# Patient Record
Sex: Male | Born: 2000 | Race: Black or African American | Hispanic: No | Marital: Single | Smoking: Current every day smoker
Health system: Southern US, Community
[De-identification: ages and names within clinical notes are randomized; demographics above are authoritative.]

## PROBLEM LIST (undated history)

## (undated) DIAGNOSIS — F909 Attention-deficit hyperactivity disorder, unspecified type: Secondary | ICD-10-CM

---
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
## 2018-12-09 ENCOUNTER — Emergency Department
Admission: EM | Admit: 2018-12-09 | Discharge: 2018-12-09 | Disposition: A | Discharge status: Home / Self Care | Payer: Medicaid Other | Attending: Emergency Medicine | Admitting: Emergency Medicine

## 2018-12-09 ENCOUNTER — Other Ambulatory Visit: Payer: Self-pay

## 2018-12-09 DIAGNOSIS — J101 Influenza due to other identified influenza virus with other respiratory manifestations: Secondary | ICD-10-CM | POA: Diagnosis not present

## 2018-12-09 DIAGNOSIS — R05 Cough: Secondary | ICD-10-CM | POA: Diagnosis present

## 2018-12-09 DIAGNOSIS — F909 Attention-deficit hyperactivity disorder, unspecified type: Secondary | ICD-10-CM | POA: Diagnosis not present

## 2018-12-09 DIAGNOSIS — J111 Influenza due to unidentified influenza virus with other respiratory manifestations: Secondary | ICD-10-CM

## 2018-12-09 LAB — INFLUENZA PANEL BY PCR (TYPE A & B)
INFLBPCR: NEGATIVE
Influenza A By PCR: POSITIVE — AB

## 2018-12-09 MED ORDER — BENZONATATE 100 MG PO CAPS
100.0000 mg | ORAL_CAPSULE | Freq: Once | ORAL | Status: AC
  Administered 2018-12-09: 100 mg via ORAL
  Filled 2018-12-09: qty 1

## 2018-12-09 NOTE — ED Triage Notes (Addendum)
Pt arrives to ED via POV from home with c/o flu-like s/x's x3-4 days. Pt reports congestion, non-productive cough, generalized body aches, and diarrhea. Pt denies vomiting. Pt took OTC medications without relief. Pt did not get this years annual flu shot.

## 2018-12-09 NOTE — ED Provider Notes (Signed)
[facility] Emergency Department Provider Note __________________________________   First MD Initiated Contact with Patient 12/09/18 0116     (approximate)  I have reviewed the triage vital signs and the nursing notes.   HISTORY  Chief Complaint Influenza   HPI Stuart Beasley is a 18 y.o. male presents to the emergency department with 4-day history of cough congestion generalized body aches and objective fever.  Patient denies any known sick contact.  She states that symptoms not relieved with over-the-counter medication.  Past Medical History:  Diagnosis Date  . ADHD     There are no active problems to display for this patient.   History reviewed. No pertinent surgical history.  Prior to Admission medications   Medication Sig Start Date End Date Taking? Authorizing Provider  ondansetron (ZOFRAN ODT) 4 MG disintegrating tablet Take 1 tablet (4 mg total) by mouth every 8 (eight) hours as needed for nausea or vomiting. 02/13/17   Menshew, Charlesetta Ivory, PA-C    Allergies Geodon [ziprasidone]  No family history on file.  Social History Social History   Tobacco Use  . Smoking status: Never Smoker  . Smokeless tobacco: Never Used  Substance Use Topics  . Alcohol use: No  . Drug use: Not on file    Review of Systems Constitutional: No fever/chills Eyes: No visual changes. ENT: No sore throat. Cardiovascular: Denies chest pain. Respiratory: Denies shortness of breath. Gastrointestinal: No abdominal pain.  No nausea, no vomiting.  No diarrhea.  No constipation. Genitourinary: Negative for dysuria. Musculoskeletal: Negative for neck pain.  Negative for back pain. Integumentary: Negative for rash. Neurological: Negative for headaches, focal weakness or numbness.   ____________________________________________   PHYSICAL EXAM:  VITAL SIGNS: ED Triage Vitals  Enc Vitals Group     BP 12/09/18 0014 (!) 142/83     Pulse Rate 12/09/18 0014  69     Resp 12/09/18 0014 17     Temp 12/09/18 0014 99.3 F (37.4 C)     Temp Source 12/09/18 0014 Oral     SpO2 12/09/18 0014 98 %     Weight 12/09/18 0013 81.2 kg (179 lb)     Height 12/09/18 0013 1.829 m (6')     Head Circumference --      Peak Flow --      Pain Score 12/09/18 0013 8     Pain Loc --      Pain Edu? --      Excl. in GC? --     Constitutional: Alert and oriented. Well appearing and in no acute distress. Eyes: Conjunctivae are normal.  Mouth/Throat: Mucous membranes are moist.  Oropharynx non-erythematous. Neck: No stridor.   Cardiovascular: Normal rate, regular rhythm. Good peripheral circulation. Grossly normal heart sounds. Respiratory: Normal respiratory effort.  No retractions. Lungs CTAB. Gastrointestinal: Soft and nontender. No distention.   Musculoskeletal: No lower extremity tenderness nor edema. No gross deformities of extremities. Neurologic:  Normal speech and language. No gross focal neurologic deficits are appreciated.  Skin:  Skin is warm, dry and intact. No rash noted.   ____________________________________________   LABS (all labs ordered are listed, but only abnormal results are displayed)  Labs Reviewed  INFLUENZA PANEL BY PCR (TYPE A & B) - Abnormal; Notable for the following components:      Result Value   Influenza A By PCR POSITIVE (*)    All other components within normal limits   _______________________    Procedures   ____________________________________________  INITIAL IMPRESSION / ASSESSMENT AND PLAN / ED COURSE  As part of my medical decision making, I reviewed the following data within the electronic MEDICAL RECORD NUMBER   18 year old male presenting with above-stated history and physical exam with suspicion for possible influenza which was confirmed on nasal swab.  Patient with no clinical findings consistent with pneumonia.  Patient out of the therapeutic window for Tamiflu and as such was not prescribed.  Woke with  the patient at length regarding management at home.    ____________________________________________  FINAL CLINICAL IMPRESSION(S) / ED DIAGNOSES  Final diagnoses:  Influenza     MEDICATIONS GIVEN DURING THIS VISIT:  Medications  benzonatate (TESSALON) capsule 100 mg (has no administration in time range)     ED Discharge Orders    None       Note:  This document was prepared using Dragon voice recognition software and may include unintentional dictation errors.   Darci CurrentBrown, [place], MD 12/09/18 [phone]

## 2019-11-09 ENCOUNTER — Emergency Department (HOSPITAL_COMMUNITY): Payer: No Typology Code available for payment source

## 2019-11-09 ENCOUNTER — Encounter (HOSPITAL_COMMUNITY): Payer: Self-pay

## 2019-11-09 ENCOUNTER — Emergency Department (HOSPITAL_COMMUNITY)
Admission: EM | Admit: 2019-11-09 | Discharge: 2019-11-09 | Disposition: A | Discharge status: Home / Self Care | Payer: No Typology Code available for payment source | Attending: Emergency Medicine | Admitting: Emergency Medicine

## 2019-11-09 ENCOUNTER — Other Ambulatory Visit: Payer: Self-pay

## 2019-11-09 DIAGNOSIS — Y9389 Activity, other specified: Secondary | ICD-10-CM | POA: Insufficient documentation

## 2019-11-09 DIAGNOSIS — S20311A Abrasion of right front wall of thorax, initial encounter: Secondary | ICD-10-CM | POA: Diagnosis not present

## 2019-11-09 DIAGNOSIS — S161XXA Strain of muscle, fascia and tendon at neck level, initial encounter: Secondary | ICD-10-CM | POA: Insufficient documentation

## 2019-11-09 DIAGNOSIS — Y999 Unspecified external cause status: Secondary | ICD-10-CM | POA: Insufficient documentation

## 2019-11-09 DIAGNOSIS — S20312A Abrasion of left front wall of thorax, initial encounter: Secondary | ICD-10-CM | POA: Insufficient documentation

## 2019-11-09 DIAGNOSIS — M791 Myalgia, unspecified site: Secondary | ICD-10-CM | POA: Insufficient documentation

## 2019-11-09 DIAGNOSIS — Y92410 Unspecified street and highway as the place of occurrence of the external cause: Secondary | ICD-10-CM | POA: Insufficient documentation

## 2019-11-09 DIAGNOSIS — R0789 Other chest pain: Secondary | ICD-10-CM | POA: Insufficient documentation

## 2019-11-09 DIAGNOSIS — M545 Low back pain: Secondary | ICD-10-CM | POA: Insufficient documentation

## 2019-11-09 DIAGNOSIS — S30811A Abrasion of abdominal wall, initial encounter: Secondary | ICD-10-CM | POA: Insufficient documentation

## 2019-11-09 DIAGNOSIS — R1030 Lower abdominal pain, unspecified: Secondary | ICD-10-CM | POA: Insufficient documentation

## 2019-11-09 DIAGNOSIS — T148XXA Other injury of unspecified body region, initial encounter: Secondary | ICD-10-CM

## 2019-11-09 DIAGNOSIS — F17210 Nicotine dependence, cigarettes, uncomplicated: Secondary | ICD-10-CM | POA: Diagnosis not present

## 2019-11-09 DIAGNOSIS — S199XXA Unspecified injury of neck, initial encounter: Secondary | ICD-10-CM | POA: Diagnosis present

## 2019-11-09 LAB — CBC WITH DIFFERENTIAL/PLATELET
Abs Immature Granulocytes: 0.05 10*3/uL (ref 0.00–0.07)
Basophils Absolute: 0 10*3/uL (ref 0.0–0.1)
Basophils Relative: 0 %
Eosinophils Absolute: 0.1 10*3/uL (ref 0.0–0.5)
Eosinophils Relative: 1 %
HCT: 43.8 % (ref 39.0–52.0)
Hemoglobin: 14.5 g/dL (ref 13.0–17.0)
Immature Granulocytes: 1 %
Lymphocytes Relative: 22 %
Lymphs Abs: 2.2 10*3/uL (ref 0.7–4.0)
MCH: 30.3 pg (ref 26.0–34.0)
MCHC: 33.1 g/dL (ref 30.0–36.0)
MCV: 91.6 fL (ref 80.0–100.0)
Monocytes Absolute: 0.8 10*3/uL (ref 0.1–1.0)
Monocytes Relative: 8 %
Neutro Abs: 6.9 10*3/uL (ref 1.7–7.7)
Neutrophils Relative %: 68 %
Platelets: 187 10*3/uL (ref 150–400)
RBC: 4.78 MIL/uL (ref 4.22–5.81)
RDW: 12.4 % (ref 11.5–15.5)
WBC: 10 10*3/uL (ref 4.0–10.5)
nRBC: 0 % (ref 0.0–0.2)

## 2019-11-09 LAB — COMPREHENSIVE METABOLIC PANEL
ALT: 22 U/L (ref 0–44)
AST: 23 U/L (ref 15–41)
Albumin: 4 g/dL (ref 3.5–5.0)
Alkaline Phosphatase: 40 U/L (ref 38–126)
Anion gap: 7 (ref 5–15)
BUN: 8 mg/dL (ref 6–20)
CO2: 27 mmol/L (ref 22–32)
Calcium: 9.3 mg/dL (ref 8.9–10.3)
Chloride: 105 mmol/L (ref 98–111)
Creatinine, Ser: 0.95 mg/dL (ref 0.61–1.24)
GFR calc Af Amer: >60 mL/min (ref >60)
GFR calc non Af Amer: >60 mL/min (ref >60)
Glucose, Bld: 96 mg/dL (ref 70–99)
Potassium: 3.9 mmol/L (ref 3.5–5.1)
Sodium: 139 mmol/L (ref 135–145)
Total Bilirubin: 0.7 mg/dL (ref 0.3–1.2)
Total Protein: 6.9 g/dL (ref 6.5–8.1)

## 2019-11-09 LAB — I-STAT CHEM 8, ED
BUN: 9 mg/dL (ref 6–20)
Calcium, Ion: 1.24 mmol/L (ref 1.15–1.40)
Chloride: 104 mmol/L (ref 98–111)
Creatinine, Ser: 1 mg/dL (ref 0.61–1.24)
Glucose, Bld: 89 mg/dL (ref 70–99)
HCT: 43 % (ref 39.0–52.0)
Hemoglobin: 14.6 g/dL (ref 13.0–17.0)
Potassium: 3.9 mmol/L (ref 3.5–5.1)
Sodium: 141 mmol/L (ref 135–145)
TCO2: 27 mmol/L (ref 22–32)

## 2019-11-09 LAB — ETHANOL: Alcohol, Ethyl (B): <10 mg/dL (ref <10)

## 2019-11-09 LAB — CBG MONITORING, ED: Glucose-Capillary: 87 mg/dL (ref 70–99)

## 2019-11-09 MED ORDER — METHOCARBAMOL 500 MG PO TABS: 500.0000 mg | ORAL_TABLET | Freq: Two times a day (BID) | ORAL | 0 refills | Status: AC

## 2019-11-09 MED ORDER — FENTANYL CITRATE (PF) 100 MCG/2ML IJ SOLN
50.0000 ug | Freq: Once | INTRAMUSCULAR | Status: AC
  Administered 2019-11-09: 50 ug via INTRAVENOUS
  Filled 2019-11-09: qty 2

## 2019-11-09 MED ORDER — IOHEXOL 300 MG/ML  SOLN
100.0000 mL | Freq: Once | INTRAMUSCULAR | Status: AC | PRN
  Administered 2019-11-09: 12:00:00 100 mL via INTRAVENOUS
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
## 2019-11-09 NOTE — ED Notes (Signed)
Walked patient to the bathroom patient did well 

## 2019-11-09 NOTE — ED Notes (Signed)
Patient verbalizes understanding of discharge instructions. Opportunity for questioning and answers were provided. Armband removed by staff, pt discharged from ED to home with family. Pt ambulatory at D/C. Given paper scrub shirt to wear home. Pt instructed not to drive while taking muscle relaxer prescribed.
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
## 2019-11-09 NOTE — ED Notes (Signed)
Pt transported to CT ?
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
## 2020-03-14 ENCOUNTER — Other Ambulatory Visit: Payer: Self-pay

## 2020-03-14 ENCOUNTER — Encounter: Payer: Self-pay | Admitting: Physician Assistant

## 2020-03-14 ENCOUNTER — Ambulatory Visit: Payer: Medicaid Other | Admitting: Physician Assistant

## 2020-03-14 DIAGNOSIS — Z202 Contact with and (suspected) exposure to infections with a predominantly sexual mode of transmission: Secondary | ICD-10-CM | POA: Diagnosis not present

## 2020-03-14 DIAGNOSIS — Z113 Encounter for screening for infections with a predominantly sexual mode of transmission: Secondary | ICD-10-CM | POA: Diagnosis not present

## 2020-03-14 DIAGNOSIS — N341 Nonspecific urethritis: Secondary | ICD-10-CM

## 2020-03-14 LAB — GRAM STAIN

## 2020-03-14 MED ORDER — AZITHROMYCIN 500 MG PO TABS
1000.0000 mg | ORAL_TABLET | Freq: Once | ORAL | Status: AC
  Administered 2020-03-14: 1000 mg via ORAL

## 2020-03-14 NOTE — Progress Notes (Signed)
Gram stain reviewed and pt treated for NGU and contact to Chlamydia per standing order and per Sadie Haber, PA order. Provider orders completed.Lyman Speller, RN

## 2020-03-14 NOTE — Progress Notes (Signed)
Pt here for STD screening. Pt reports he is a contact to Chlamydia..Onnie Hatchel, RN 

## 2020-03-15 NOTE — Progress Notes (Signed)
[facility] Department STI clinic/screening visit  Subjective:  Stuart Beasley is a 19 y.o. male being seen today for an STI screening visit. The patient reports they do not have symptoms.    Patient has the following medical conditions:  There are no problems to display for this patient.    Chief Complaint  Patient presents with  . SEXUALLY TRANSMITTED DISEASE    STD screening    HPI  Patient reports that he is not having any symptoms but is a contact to Chlamydia and would like a screening today.  Reports that he has ADHD but takes no medicine. 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     See flowsheet for further details and programmatic requirements.    The following portions of the patient's history were reviewed and updated as appropriate: allergies, current medications, past medical history, past social history, past surgical history and problem list.  Objective:  There were no vitals filed for this visit.  Physical Exam Constitutional:      General: He is not in acute distress.    Appearance: Normal appearance.  HENT:     Head: Normocephalic and atraumatic.     Comments: No nits, lice or hair loss. No cervical, supraclavicular or axillary adenopathy.    Mouth/Throat:     Mouth: Mucous membranes are moist.     Pharynx: Oropharynx is clear. No oropharyngeal exudate or posterior oropharyngeal erythema.  Eyes:     Conjunctiva/sclera: Conjunctivae normal.  Pulmonary:     Effort: Pulmonary effort is normal.  Abdominal:     Palpations: Abdomen is soft. There is no mass.     Tenderness: There is no abdominal tenderness. There is no guarding or rebound.  Genitourinary:    Penis: Normal.      Testes: Normal.     Comments: Pubic area without nits, lice , edema, erythema,lesions and inguinal adenopathy. Penis circumcised, without rash, lesions and discharge at meatus. Musculoskeletal:     Cervical back: Neck  supple. No tenderness.  Skin:    General: Skin is warm and dry.     Findings: No bruising, erythema, lesion or rash.  Neurological:     Mental Status: He is alert and oriented to person, place, and time.  Psychiatric:        Mood and Affect: Mood normal.        Behavior: Behavior normal.        Thought Content: Thought content normal.        Judgment: Judgment normal.       Assessment and Plan:  Stuart Beasley is a 19 y.o. male presenting to the [facility] Department for STI screening  1. Screening for STD (sexually transmitted disease) Patient into clinic without symptoms.   Rec condoms with all sex. Await test results.  Counseled that RN will call if needs to RTC for further treatment. - Gram stain - Gonococcus culture - HIV/HCV [facility] - Syphilis Serology, [facility]  2. Chlamydia contact Treat as contact to Chlamydia with Azithromycin 1g po DOT No sex for 7 days and until after partner completes treatment. RTC for re-treatment if vomits < 2 hr after taking medicine. - azithromycin (ZITHROMAX) tablet 1,000 mg  3. NGU (nongonococcal urethritis) See above. - azithromycin (ZITHROMAX) tablet 1,000 mg     Return in about 3 months (around 06/14/2020), or TOC & PRN.  No future appointments.  Matt Holmes, PA

## 2020-03-19 LAB — GONOCOCCUS CULTURE

## 2020-03-22 LAB — HM HIV SCREENING LAB: HM HIV Screening: NEGATIVE

## 2020-03-22 LAB — HM HEPATITIS C SCREENING LAB: HM Hepatitis Screen: NEGATIVE

## 2020-03-28 ENCOUNTER — Encounter: Payer: Self-pay | Admitting: Family Medicine

## 2020-05-01 ENCOUNTER — Ambulatory Visit: Payer: Medicaid Other

## 2020-05-01 ENCOUNTER — Other Ambulatory Visit: Payer: Self-pay

## 2020-05-03 ENCOUNTER — Ambulatory Visit: Payer: Medicaid Other
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
## 2021-03-03 ENCOUNTER — Other Ambulatory Visit: Payer: Self-pay

## 2021-03-03 ENCOUNTER — Emergency Department
Admission: EM | Admit: 2021-03-03 | Discharge: 2021-03-03 | Disposition: A | Discharge status: Home / Self Care | Payer: Medicaid Other | Attending: Emergency Medicine | Admitting: Emergency Medicine

## 2021-03-03 ENCOUNTER — Encounter: Payer: Self-pay | Admitting: Emergency Medicine

## 2021-03-03 DIAGNOSIS — L0231 Cutaneous abscess of buttock: Secondary | ICD-10-CM | POA: Insufficient documentation

## 2021-03-03 DIAGNOSIS — F17210 Nicotine dependence, cigarettes, uncomplicated: Secondary | ICD-10-CM | POA: Insufficient documentation

## 2021-03-03 MED ORDER — HYDROCODONE-ACETAMINOPHEN 5-325 MG PO TABS: 1.0000 | ORAL_TABLET | ORAL | 0 refills | Status: AC | PRN

## 2021-03-03 MED ORDER — SULFAMETHOXAZOLE-TRIMETHOPRIM 800-160 MG PO TABS
1.0000 | ORAL_TABLET | Freq: Once | ORAL | Status: AC
  Administered 2021-03-03: 1 via ORAL
  Filled 2021-03-03: qty 1

## 2021-03-03 MED ORDER — SULFAMETHOXAZOLE-TRIMETHOPRIM 800-160 MG PO TABS: 1.0000 | ORAL_TABLET | Freq: Two times a day (BID) | ORAL | 0 refills | Status: AC

## 2021-03-03 NOTE — ED Triage Notes (Signed)
Pt reports abscess to right buttocks for the past week that is painful.

## 2021-03-03 NOTE — ED Provider Notes (Signed)
[facility] Emergency Department Provider Note  ____________________________________________  Time seen: Approximately 5:43 PM  I have reviewed the triage vital signs and the nursing notes.   HISTORY  Chief Complaint Abscess    HPI Stuart Beasley is a 20 y.o. male who presents the emergency department complaining of possible abscess to the right buttock.  Patient states that he has had 1 other previous abscess that required incision and drainage in the superior aspect of the intergluteal cleft.  Patient states over the last 7 to 10 days he has had increasing pain, erythema to the area and believes that he may have an abscess currently.  He does not believe that he is having any kind of drainage at this time.  No perirectal/perianal pain.  No other complaints currently.         Past Medical History:  Diagnosis Date  . ADHD     There are no problems to display for this patient.   History reviewed. No pertinent surgical history.  Prior to Admission medications   Medication Sig Start Date End Date Taking? Authorizing Provider  HYDROcodone-acetaminophen (NORCO/VICODIN) 5-325 MG tablet Take 1 tablet by mouth every 4 (four) hours as needed for moderate pain. 03/03/21 03/03/22 Yes Abdimalik Mayorquin, Delorise Royals, PA-C  sulfamethoxazole-trimethoprim (BACTRIM DS) 800-160 MG tablet Take 1 tablet by mouth 2 (two) times daily. 03/03/21  Yes Kylee Nardozzi, Delorise Royals, PA-C  methocarbamol (ROBAXIN) 500 MG tablet Take 1 tablet (500 mg total) by mouth 2 (two) times daily. 11/09/19   Khatri, Hina, PA-C    Allergies Geodon [ziprasidone]  No family history on file.  Social History Social History   Tobacco Use  . Smoking status: Current Every Day Smoker    Packs/day: 0.50    Years: 2.00    Pack years: 1.00    Types: Cigarettes  . Smokeless tobacco: Never Used  Substance Use Topics  . Alcohol use: No  . Drug use: Yes    Types: Marijuana    Comment: "every couple of days"      Review of Systems  Constitutional: No fever/chills Eyes: No visual changes. No discharge ENT: No upper respiratory complaints. Cardiovascular: no chest pain. Respiratory: no cough. No SOB. Gastrointestinal: No abdominal pain.  No nausea, no vomiting.  No diarrhea.  No constipation. Musculoskeletal: Negative for musculoskeletal pain. Skin: Possible abscess to the right buttock Neurological: Negative for headaches, focal weakness or numbness.  10 System ROS otherwise negative.  ____________________________________________   PHYSICAL EXAM:  VITAL SIGNS: ED Triage Vitals  Enc Vitals Group     BP 03/03/21 1429 (!) 150/82     Pulse Rate 03/03/21 1429 90     Resp 03/03/21 1429 18     Temp 03/03/21 1429 98.7 F (37.1 C)     Temp Source 03/03/21 1429 Oral     SpO2 03/03/21 1429 100 %     Weight 03/03/21 1422 160 lb (72.6 kg)     Height 03/03/21 1422 6' (1.829 m)     Head Circumference --      Peak Flow --      Pain Score 03/03/21 1422 10     Pain Loc --      Pain Edu? --      Excl. in GC? --      Constitutional: Alert and oriented. Well appearing and in no acute distress. Eyes: Conjunctivae are normal. PERRL. EOMI. Head: Atraumatic. ENT:      Ears:       Nose: No  congestion/rhinnorhea.      Mouth/Throat: Mucous membranes are moist.  Neck: No stridor.   Cardiovascular: Normal rate, regular rhythm. Normal S1 and S2.  Good peripheral circulation. Respiratory: Normal respiratory effort without tachypnea or retractions. Lungs CTAB. Good air entry to the bases with no decreased or absent breath sounds. Musculoskeletal: Full range of motion to all extremities. No gross deformities appreciated. Neurologic:  Normal speech and language. No gross focal neurologic deficits are appreciated.   Skin:  Skin is warm, dry and intact. No rash noted.  Visualization of the right buttock revealed an erythematous lesion along the medial aspect of the buttock.  There is no extension around  the perianal area.  Area is tender to palpation with induration and fluctuance.  Palpation reveals purulent drainage. Psychiatric: Mood and affect are normal. Speech and behavior are normal. Patient exhibits appropriate insight and judgement.   ____________________________________________   LABS (all labs ordered are listed, but only abnormal results are displayed)  Labs Reviewed - No data to display ____________________________________________  EKG   ____________________________________________  RADIOLOGY   No results found.  ____________________________________________    PROCEDURES  Procedure(s) performed:    Procedures    Medications  sulfamethoxazole-trimethoprim (BACTRIM DS) 800-160 MG per tablet 1 tablet (has no administration in time range)     ____________________________________________   INITIAL IMPRESSION / ASSESSMENT AND PLAN / ED COURSE  Pertinent labs & imaging results that were available during my care of the patient were reviewed by me and considered in my medical decision making (see chart for details).  Review of the [place] CSRS was performed in accordance of the [facility] prior to dispensing any controlled drugs.           Patient's diagnosis is consistent with abscess to the right buttock.  Patient presented to the emergency department with pain and erythematous lesion to the buttock.  He has a history of 1 previous abscess.  Findings concerning for abscess on exam.  This is along the medial aspect of the buttock but there is no evidence of extension to the perianal/perirectal region.  He is not having any rectal pain.  This lesion is spontaneously draining at this time.  I have encouraged use of warm compress to continue the drainage.  Patient opts to not open the abscess any further but states that he will return if the drainage stops or the pain starts increasing.  Patiently placed on Bactrim for same.  Return precautions discussed with the  patient.  Patient will have limited prescription for Norco as this area heals.. Patient is given ED precautions to return to the ED for any worsening or new symptoms.     ____________________________________________  FINAL CLINICAL IMPRESSION(S) / ED DIAGNOSES  Final diagnoses:  Abscess of right buttock      NEW MEDICATIONS STARTED DURING THIS VISIT:  ED Discharge Orders         Ordered    sulfamethoxazole-trimethoprim (BACTRIM DS) 800-160 MG tablet  2 times daily        03/03/21 1748    HYDROcodone-acetaminophen (NORCO/VICODIN) 5-325 MG tablet  Every 4 hours PRN        03/03/21 1748              This chart was dictated using voice recognition software/Dragon. Despite best efforts to proofread, errors can occur which can change the meaning. Any change was purely unintentional.    Racheal Patches, PA-C 03/03/21 1749    Merwyn Katos, MD 03/04/21 [phone]

## 2021-07-26 ENCOUNTER — Emergency Department
Admission: EM | Admit: 2021-07-26 | Discharge: 2021-07-27 | Disposition: A | Discharge status: Home / Self Care | Payer: Medicaid Other | Attending: Emergency Medicine | Admitting: Emergency Medicine

## 2021-07-26 DIAGNOSIS — M79674 Pain in right toe(s): Secondary | ICD-10-CM | POA: Insufficient documentation

## 2021-07-26 DIAGNOSIS — M7989 Other specified soft tissue disorders: Secondary | ICD-10-CM | POA: Diagnosis not present

## 2021-07-26 DIAGNOSIS — Z5321 Procedure and treatment not carried out due to patient leaving prior to being seen by health care provider: Secondary | ICD-10-CM | POA: Insufficient documentation

## 2021-07-26 NOTE — ED Triage Notes (Signed)
Pt states he may have a splinter in his right great toe that has been there for around a month and is now painful and swollen. Pt ambulatory
